# Patient Record
Sex: Female | Born: 1995 | Race: White | Hispanic: No | Marital: Single | Smoking: Never smoker
Health system: Southern US, Community
[De-identification: ages and names within clinical notes are randomized; demographics above are authoritative.]

## PROBLEM LIST (undated history)

## (undated) DIAGNOSIS — F419 Anxiety disorder, unspecified: Secondary | ICD-10-CM

## (undated) DIAGNOSIS — J45909 Unspecified asthma, uncomplicated: Secondary | ICD-10-CM

---

## 2016-08-01 ENCOUNTER — Encounter (HOSPITAL_COMMUNITY): Payer: Self-pay | Admitting: Emergency Medicine

## 2016-08-01 ENCOUNTER — Ambulatory Visit (HOSPITAL_COMMUNITY)
Admission: EM | Admit: 2016-08-01 | Discharge: 2016-08-01 | Disposition: A | Discharge status: Home / Self Care | Payer: BLUE CROSS/BLUE SHIELD | Attending: Emergency Medicine | Admitting: Emergency Medicine

## 2016-08-01 DIAGNOSIS — N730 Acute parametritis and pelvic cellulitis: Secondary | ICD-10-CM | POA: Diagnosis not present

## 2016-08-01 DIAGNOSIS — R109 Unspecified abdominal pain: Secondary | ICD-10-CM | POA: Diagnosis not present

## 2016-08-01 DIAGNOSIS — F419 Anxiety disorder, unspecified: Secondary | ICD-10-CM | POA: Diagnosis not present

## 2016-08-01 DIAGNOSIS — J45909 Unspecified asthma, uncomplicated: Secondary | ICD-10-CM | POA: Insufficient documentation

## 2016-08-01 HISTORY — DX: Unspecified asthma, uncomplicated: J45.909

## 2016-08-01 HISTORY — DX: Anxiety disorder, unspecified: F41.9

## 2016-08-01 MED ORDER — CEFTRIAXONE SODIUM 250 MG IJ SOLR
250.0000 mg | Freq: Once | INTRAMUSCULAR | Status: AC
  Administered 2016-08-01: 250 mg via INTRAMUSCULAR

## 2016-08-01 MED ORDER — DOXYCYCLINE HYCLATE 100 MG PO CAPS: 100.0000 mg | ORAL_CAPSULE | Freq: Two times a day (BID) | ORAL | 0 refills | Status: AC

## 2016-08-01 MED ORDER — AZITHROMYCIN 250 MG PO TABS
1000.0000 mg | ORAL_TABLET | Freq: Once | ORAL | Status: AC
  Administered 2016-08-01: 1000 mg via ORAL

## 2016-08-01 MED ORDER — AZITHROMYCIN 250 MG PO TABS
ORAL_TABLET | ORAL | Status: AC
  Filled 2016-08-01: qty 4

## 2016-08-01 MED ORDER — CEFTRIAXONE SODIUM 250 MG IJ SOLR
INTRAMUSCULAR | Status: AC
  Filled 2016-08-01: qty 250

## 2016-08-01 NOTE — ED Provider Notes (Signed)
[facility]    CSN: [id number] Arrival date & time: 08/01/16  1737     History   Chief Complaint Chief Complaint  Patient presents with  . Abdominal Cramping    HPI Sonya Mcfarland is a 21 y.o. female.   HPI She is a 21 year old woman here for evaluation of lower abdominal pain. She reports severe lower abdominal pain that started this morning. It has been constant. Feels a little better when she lays down. Yesterday she had vomiting and diarrhea. Today she has felt nauseous, but had no vomiting. She did have a small bowel movement, but states it was painful to try to have a bowel movement. She also states she has abdominal discomfort with urination. She denies any dysuria, urgency, or frequency. No fevers. She is sexually active. She has had a new partner recently. They use condoms. She states she has had similar, but less severe episodes of pain previously. She reports for other episodes between Thanksgiving and today.  Past Medical History:  Diagnosis Date  . Anxiety   . Asthma     There are no active problems to display for this patient.   History reviewed. No pertinent surgical history.  OB History    No data available       Home Medications    Prior to Admission medications   Medication Sig Start Date End Date Taking? Authorizing Provider  montelukast (SINGULAIR) 10 MG tablet Take 10 mg by mouth at bedtime.   Yes Historical Provider, MD  Norgestim-Eth Estrad Triphasic (TRINESSA, 28, PO) Take by mouth.   Yes Historical Provider, MD  PARoxetine (PAXIL) 20 MG tablet Take 20 mg by mouth daily.   Yes Historical Provider, MD  doxycycline (VIBRAMYCIN) 100 MG capsule Take 1 capsule (100 mg total) by mouth 2 (two) times daily. 08/01/16   Charm RingsErin J Malessa Zartman, MD    Family History History reviewed. No pertinent family history.  Social History Social History  Substance Use Topics  . Smoking status: Never Smoker  . Smokeless tobacco: Never Used  . Alcohol use Yes     Comment: Occ     Allergies   Patient has no known allergies.   Review of Systems Review of Systems As in history of present illness  Physical Exam Triage Vital Signs ED Triage Vitals  Enc Vitals Group     BP 08/01/16 1916 131/74     Pulse Rate 08/01/16 1916 101     Resp 08/01/16 1916 16     Temp 08/01/16 1916 98.4 F (36.9 C)     Temp Source 08/01/16 1916 Oral     SpO2 08/01/16 1916 99 %     Weight --      Height --      Head Circumference --      Peak Flow --      Pain Score 08/01/16 1921 8     Pain Loc --      Pain Edu? --      Excl. in GC? --    No data found.   Updated Vital Signs BP 131/74 (BP Location: Left Arm)   Pulse 101   Temp 98.4 F (36.9 C) (Oral)   Resp 16   LMP 07/31/2016 (Exact Date)   SpO2 99%   Visual Acuity Right Eye Distance:   Left Eye Distance:   Bilateral Distance:    Right Eye Near:   Left Eye Near:    Bilateral Near:     Physical Exam  Constitutional: She appears well-developed and well-nourished. No distress.  Cardiovascular: Normal rate.   Pulmonary/Chest: Effort normal.  Abdominal: Soft. She exhibits no distension. There is tenderness (all across lower abdomen, slightly worse on the right lower quadrant.). There is no rebound and no guarding.  Positive heel drop.  Genitourinary: There is no rash on the right labia. There is no rash on the left labia. Uterus is tender. Uterus is not enlarged. Cervix exhibits motion tenderness. Cervix exhibits no discharge. Right adnexum displays tenderness. Right adnexum displays no mass. Left adnexum displays no mass and no tenderness. There is bleeding (on period) in the vagina. No foreign body in the vagina. No vaginal discharge found.     UC Treatments / Results  Labs (all labs ordered are listed, but only abnormal results are displayed) Labs Reviewed  CERVICOVAGINAL ANCILLARY ONLY    EKG  EKG Interpretation None       Radiology No results found.  Procedures Procedures  (including critical care time)  Medications Ordered in UC Medications  cefTRIAXone (ROCEPHIN) injection 250 mg (250 mg Intramuscular Given 08/01/16 2029)  azithromycin (ZITHROMAX) tablet 1,000 mg (1,000 mg Oral Given 08/01/16 2031)     Initial Impression / Assessment and Plan / UC Course  I have reviewed the triage vital signs and the nursing notes.  Pertinent labs & imaging results that were available during my care of the patient were reviewed by me and considered in my medical decision making (see chart for details).  Clinical Course     We'll treat for PID with Rocephin and azithromycin here. Prescription for doxycycline sent to pharmacy. Return precautions reviewed. Appendicitis is in the differential, but given cervical motion tenderness, I feel PID is more likely.  Final Clinical Impressions(s) / UC Diagnoses   Final diagnoses:  PID (acute pelvic inflammatory disease)    New Prescriptions New Prescriptions   DOXYCYCLINE (VIBRAMYCIN) 100 MG CAPSULE    Take 1 capsule (100 mg total) by mouth 2 (two) times daily.     Charm Rings, MD 08/01/16 2046

## 2016-08-01 NOTE — ED Triage Notes (Signed)
[facility] a [facility] abdominal cramping with N/V/D that started yesterday.

## 2016-08-01 NOTE — Discharge Instructions (Signed)
I'm concerned you have something called pelvic inflammatory disease. We gave you some medicine here. Take doxycycline twice a day for 10 days. We will call you with the results of your testing in 2-3 days. If you develop fevers, worsening pain, or the pain localizes to the right side, please go to the emergency room.

## 2016-08-02 ENCOUNTER — Emergency Department (HOSPITAL_COMMUNITY)
Admission: EM | Admit: 2016-08-02 | Discharge: 2016-08-02 | Disposition: A | Discharge status: Home / Self Care | Payer: BLUE CROSS/BLUE SHIELD | Attending: Emergency Medicine | Admitting: Emergency Medicine

## 2016-08-02 ENCOUNTER — Emergency Department (HOSPITAL_COMMUNITY): Payer: BLUE CROSS/BLUE SHIELD

## 2016-08-02 ENCOUNTER — Encounter (HOSPITAL_COMMUNITY): Payer: Self-pay | Admitting: Emergency Medicine

## 2016-08-02 DIAGNOSIS — J45909 Unspecified asthma, uncomplicated: Secondary | ICD-10-CM | POA: Diagnosis not present

## 2016-08-02 DIAGNOSIS — B9689 Other specified bacterial agents as the cause of diseases classified elsewhere: Secondary | ICD-10-CM | POA: Diagnosis not present

## 2016-08-02 DIAGNOSIS — R1031 Right lower quadrant pain: Secondary | ICD-10-CM | POA: Diagnosis not present

## 2016-08-02 DIAGNOSIS — N739 Female pelvic inflammatory disease, unspecified: Secondary | ICD-10-CM

## 2016-08-02 LAB — COMPREHENSIVE METABOLIC PANEL
ALT: 13 U/L — ABNORMAL LOW (ref 14–54)
AST: 19 U/L (ref 15–41)
Albumin: 4.1 g/dL (ref 3.5–5.0)
Alkaline Phosphatase: 86 U/L (ref 38–126)
Anion gap: 10 (ref 5–15)
BUN: 14 mg/dL (ref 6–20)
CHLORIDE: 102 mmol/L (ref 101–111)
CO2: 26 mmol/L (ref 22–32)
Calcium: 9.6 mg/dL (ref 8.9–10.3)
Creatinine, Ser: 0.68 mg/dL (ref 0.44–1.00)
Glucose, Bld: 91 mg/dL (ref 65–99)
Potassium: 3.9 mmol/L (ref 3.5–5.1)
Sodium: 138 mmol/L (ref 135–145)
Total Bilirubin: 0.5 mg/dL (ref 0.3–1.2)
Total Protein: 7.7 g/dL (ref 6.5–8.1)

## 2016-08-02 LAB — URINALYSIS, ROUTINE W REFLEX MICROSCOPIC
Bilirubin Urine: NEGATIVE
GLUCOSE, UA: NEGATIVE mg/dL
Ketones, ur: NEGATIVE mg/dL
Leukocytes, UA: NEGATIVE
NITRITE: NEGATIVE
PH: 5 (ref 5.0–8.0)
PROTEIN: NEGATIVE mg/dL
SPECIFIC GRAVITY, URINE: 1.019 (ref 1.005–1.030)

## 2016-08-02 LAB — CBC
HCT: 39.5 % (ref 36.0–46.0)
Hemoglobin: 13 g/dL (ref 12.0–15.0)
MCH: 27.5 pg (ref 26.0–34.0)
MCHC: 32.9 g/dL (ref 30.0–36.0)
MCV: 83.5 fL (ref 78.0–100.0)
Platelets: 337 10*3/uL (ref 150–400)
RBC: 4.73 MIL/uL (ref 3.87–5.11)
RDW: 14.2 % (ref 11.5–15.5)
WBC: 7.6 10*3/uL (ref 4.0–10.5)

## 2016-08-02 LAB — I-STAT BETA HCG BLOOD, ED (MC, WL, AP ONLY): I-stat hCG, quantitative: <5 m[IU]/mL (ref <5)

## 2016-08-02 LAB — LIPASE, BLOOD: LIPASE: 17 U/L (ref 11–51)

## 2016-08-02 MED ORDER — ONDANSETRON 4 MG PO TBDP
4.0000 mg | ORAL_TABLET | Freq: Once | ORAL | Status: AC | PRN
  Administered 2016-08-02: 4 mg via ORAL

## 2016-08-02 MED ORDER — ONDANSETRON 4 MG PO TBDP
ORAL_TABLET | ORAL | Status: AC
  Filled 2016-08-02: qty 1

## 2016-08-02 MED ORDER — SODIUM CHLORIDE 0.9 % IV BOLUS (SEPSIS)
1000.0000 mL | Freq: Once | INTRAVENOUS | Status: AC
Start: 2016-08-02 — End: 2016-08-02
  Administered 2016-08-02: 1000 mL via INTRAVENOUS

## 2016-08-02 MED ORDER — TAPENTADOL HCL 50 MG PO TABS: 25.0000 mg | ORAL_TABLET | Freq: Three times a day (TID) | ORAL | 0 refills | Status: AC | PRN

## 2016-08-02 MED ORDER — SODIUM CHLORIDE 0.9 % IV BOLUS (SEPSIS)
1000.0000 mL | Freq: Once | INTRAVENOUS | Status: AC
  Administered 2016-08-02: 1000 mL via INTRAVENOUS

## 2016-08-02 MED ORDER — MORPHINE SULFATE (PF) 4 MG/ML IV SOLN
4.0000 mg | Freq: Once | INTRAVENOUS | Status: AC
  Administered 2016-08-02: 4 mg via INTRAVENOUS
  Filled 2016-08-02: qty 1

## 2016-08-02 MED ORDER — ONDANSETRON HCL 4 MG/2ML IJ SOLN
4.0000 mg | Freq: Once | INTRAMUSCULAR | Status: AC
  Administered 2016-08-02: 4 mg via INTRAVENOUS
  Filled 2016-08-02: qty 2

## 2016-08-02 MED ORDER — ONDANSETRON HCL 4 MG PO TABS: 4.0000 mg | ORAL_TABLET | Freq: Three times a day (TID) | ORAL | 0 refills | Status: AC | PRN

## 2016-08-02 MED ORDER — IOPAMIDOL (ISOVUE-300) INJECTION 61%
INTRAVENOUS | Status: AC
  Administered 2016-08-02: 100 mL
  Filled 2016-08-02: qty 100

## 2016-08-02 NOTE — ED Notes (Signed)
Pt transported to CT ?

## 2016-08-02 NOTE — ED Notes (Signed)
ED Provider at bedside. 

## 2016-08-02 NOTE — ED Triage Notes (Signed)
Pt sts RLQ pain with N/V/D starting 2 days ago; [facility]; pt sts pain worse and more on right sided this am

## 2016-08-02 NOTE — ED Notes (Addendum)
Pt mother expressing concern over daughter's d/c. Dr. Ladona Ridgelaylor aware, will have Dr. Jacqulyn BathLong assess pt further before she leaves.

## 2016-08-02 NOTE — ED Notes (Signed)
This RN called again to CT was told they will be coming shortly to take patient to CT.

## 2016-08-02 NOTE — ED Notes (Signed)
This RN called CT to inform them she is ready for transport.

## 2016-08-02 NOTE — ED Provider Notes (Signed)
[facility] Note   CSN: [id number] Arrival date & time: 08/02/16  1157     History   Chief Complaint Chief Complaint  Patient presents with  . Abdominal Pain    HPI Sonya Mcfarland is a 21 y.o. female. Sonya Mcfarland is a 21 year old female with no significant past medical history who presents with 36 hour history of abdominal pain, nausea and vomiting. The patient first developed abdominal pain yesterday which woke her up from sleep. This pain was located bilaterally in the lower quadrants and was not well localized. It was a crampy, intense, contuous pain. At this time the patient also had several episodes of diarrhea. She went to an urgent care center and was evaluated. At that time she was afebrile and hemodynamically stable. Pregnancy test was negative. Physical examination at her prior visit demonstrated cervical motion tenderness and a diagnosis of pelvic inflammatory disease was made. She was given a single dose of ceftriaxone and azithromycin and a prescription for doxycycline to complete in the outpatient setting to cover the most common pathogens associated with pelvic inflammatory disease. At time of discharge her return instructions indicated that if she developed nausea, vomiting or if the pain became localized to return to the emergency department. This morning the patient says that her pain became more localized to the right side of her umbilicus. She also developed several episodes of nonbloody nonbilious biliary emesis. She continued to have diarrhea. At presentation to the emergency department today the patient has been afebrile. She is tachycardic but is otherwise hemodynamically stable. Initial laboratory evaluation was unremarkable. She denied chest pain or shortness of breath. She denied constipation. She denied joint aches. She denied fevers. She denied new foods, diet or travel. She denies any sick contacts. She has no additional acute complaints or concerns  today. HPI  Past Medical History:  Diagnosis Date  . Anxiety   . Asthma     There are no active problems to display for this patient.   History reviewed. No pertinent surgical history.  OB History    No data available       Home Medications    Prior to Admission medications   Medication Sig Start Date End Date Taking? Authorizing Provider  doxycycline (VIBRAMYCIN) 100 MG capsule Take 1 capsule (100 mg total) by mouth 2 (two) times daily. 08/01/16   Charm RingsErin J Honig, MD  montelukast (SINGULAIR) 10 MG tablet Take 10 mg by mouth at bedtime.    Historical Provider, MD  Norgestim-Eth Estrad Triphasic (TRINESSA, 28, PO) Take by mouth.    Historical Provider, MD  ondansetron (ZOFRAN) 4 MG tablet Take 1 tablet (4 mg total) by mouth every 8 (eight) hours as needed for nausea or vomiting. 08/02/16   Thomasene LotJames Willard Madrigal, MD  PARoxetine (PAXIL) 20 MG tablet Take 20 mg by mouth daily.    Historical Provider, MD  tapentadol (NUCYNTA) 50 MG tablet Take 0.5 tablets (25 mg total) by mouth every 8 (eight) hours as needed. 08/02/16   Thomasene LotJames Adedamola Seto, MD    Family History History reviewed. No pertinent family history.  Social History Social History  Substance Use Topics  . Smoking status: Never Smoker  . Smokeless tobacco: Never Used  . Alcohol use Yes     Comment: Occ     Allergies   Patient has no known allergies.   Review of Systems Review of Systems  All other systems reviewed and are negative.    Physical Exam Updated Vital Signs BP 112/79 (BP  Location: Right Arm)   Pulse 112   Temp 98 F (36.7 C) (Oral)   Resp 18   LMP 07/31/2016 (Exact Date)   SpO2 99%   Physical Exam  Constitutional: She is oriented to person, place, and time. She appears well-developed and well-nourished.  HENT:  Head: Normocephalic.  Cardiovascular: Exam reveals no friction rub.   No murmur heard. Tachycardic on auscultation.  Pulmonary/Chest: Effort normal and breath sounds normal. No respiratory distress.  She has no wheezes.  Abdominal: Bowel sounds are normal. There is tenderness.  Tenderness to palpation periumbilically and in the suprapubic area. No tenderness to palpation in the left lower left upper quadrant. No rebound or guarding. Not a surgical abdomen.  Musculoskeletal: She exhibits no edema.  Neurological: She is alert and oriented to person, place, and time.     ED Treatments / Results  Labs (all labs ordered are listed, but only abnormal results are displayed) Labs Reviewed  COMPREHENSIVE METABOLIC PANEL - Abnormal; Notable for the following:       Result Value   ALT 13 (*)    All other components within normal limits  URINALYSIS, ROUTINE W REFLEX MICROSCOPIC - Abnormal; Notable for the following:    APPearance HAZY (*)    Hgb urine dipstick MODERATE (*)    Bacteria, UA MANY (*)    Squamous Epithelial / LPF 0-5 (*)    All other components within normal limits  LIPASE, BLOOD  CBC  I-STAT BETA HCG BLOOD, ED ([facility], [facility], [facility] ONLY)    EKG  EKG Interpretation None       Radiology Ct Abdomen Pelvis W Contrast  Result Date: 08/02/2016 CLINICAL DATA:  Right lower quadrant abdominal pain, nausea, vomiting and diarrhea for 2 days. EXAM: CT ABDOMEN AND PELVIS WITH CONTRAST TECHNIQUE: Multidetector CT imaging of the abdomen and pelvis was performed using the standard protocol following bolus administration of intravenous contrast. CONTRAST:  ISOVUE-300 IOPAMIDOL (ISOVUE-300) INJECTION 61% COMPARISON:  None FINDINGS: Lower chest: The lung bases are clear of acute process. No pleural effusion or pulmonary lesions. The heart is normal in size. No pericardial effusion. The distal esophagus and aorta are unremarkable. Hepatobiliary: No focal hepatic lesions or intrahepatic biliary dilatation. The gallbladder is normal. No common bile duct dilatation. A Pancreas: No mass, inflammation or ductal dilatation. Spleen: Normal size.  No focal lesions. Adrenals/Urinary Tract: The adrenal  glands and kidneys are unremarkable. Stomach/Bowel: The stomach, duodenum, small bowel and colon are grossly normal without oral contrast. No obvious inflammatory changes, mass lesions or obstructive findings. The terminal ileum appears normal. The appendix is not identified for certain but I do not see any definite findings for acute appendicitis. Oral contrast would have been helpful as there are fluid-filled loops of small bowel in the right lower quadrant. Vascular/Lymphatic: The aorta and branch vessels are normal. The major venous structures are patent. Moderate sized scattered mesenteric lymph nodes suggesting mesenteric adenitis. Reproductive: Normal uterus and ovaries. Other: No pelvic mass or adenopathy. No free pelvic fluid collections. No inguinal mass or adenopathy. No abdominal wall hernia or subcutaneous lesions. Musculoskeletal: No significant bony findings. IMPRESSION: 1. Numerous borderline mesenteric lymph nodes suggesting mesenteric adenitis. 2. No definite CT findings for appendicitis but the appendix is not identified for certain. Exam limited by un opacified small bowel loops in the right lower quadrant. Recommend close clinical observation and repeat study with oral contrast if the patient does not improve. 3. Normal CT appearance of the uterus and ovaries. 4.  No acute abdominal findings. Electronically Signed   By: Rudie Meyer M.D.   On: 08/02/2016 19:31    Procedures Procedures (including critical care time)  Medications Ordered in ED Medications  ondansetron (ZOFRAN-ODT) disintegrating tablet 4 mg (4 mg Oral Given 08/02/16 1207)  sodium chloride 0.9 % bolus 1,000 mL (0 mLs Intravenous Stopped 08/02/16 1946)  sodium chloride 0.9 % bolus 1,000 mL (0 mLs Intravenous Stopped 08/02/16 1840)  morphine 4 MG/ML injection 4 mg (4 mg Intravenous Given 08/02/16 1729)  ondansetron (ZOFRAN) injection 4 mg (4 mg Intravenous Given 08/02/16 1728)  iopamidol (ISOVUE-300) 61 % injection (100 mLs   Contrast Given 08/02/16 1904)     Initial Impression / Assessment and Plan / ED Course  I have reviewed the triage vital signs and the nursing notes.  Pertinent labs & imaging results that were available during my care of the patient were reviewed by me and considered in my medical decision making (see chart for details).  Clinical Course     Patient presents with ongoing periumbilical abdominal pain associated with nausea, vomiting and diarrhea. Recently, a diagnosis of pelvic inflammatory disease was made yesterday at urgent care clinic and she was treated with antimicrobial therapy. Her pain has become more localized since and she states that her  nausea and vomiting are new. Currently the differential diagnosis includes pelvic inflammatory disease, appendicitis, tubo-ovarian cyst or dysmenorrhea. Initial laboratory evaluation is unremarkable. The patient was tachycardic on examination and I think this is most likely secondary to dehydration in the setting of emesis. At this time we'll proceed with CT abdomen and pelvis with oral and IV contrast to evaluate the above pathology. We will also give the patient a 1 L normal saline bolus. CT abdomen and pelvis with contrast showed mesenteric adenitis. Appendix was not appreciated on this. However, given the patient's laboratory evaluation is normal, she is afebrile and her examination is not consistent with acute appendicitis I think the most likely etiology of the patient's abdominal pain is either secondary to pelvic inflammatory disease with reactive mesenteric lymph nodes or acute viral gastroenteritis. For now, I will provide the patient with prescriptions for supportive medications including Zofran and tapentadol and discussed with her signs and symptoms that would be concerning and would require her to return to the hospital. Additionally, I'll recommend that she follow-up with a primary care physician in 24-48 hours for reexamination and evaluation.  At the time of discharge she was afebrile. Again, she will need follow-up with primary care physician in 24-48 hours for reexamination. I will also recommend that she finish her course of doxycycline prescribed at her previous visit.  Final Clinical Impressions(s) / ED Diagnoses   Final diagnoses:  Pelvic inflammatory disease (PID)    New Prescriptions New Prescriptions   ONDANSETRON (ZOFRAN) 4 MG TABLET    Take 1 tablet (4 mg total) by mouth every 8 (eight) hours as needed for nausea or vomiting.   TAPENTADOL (NUCYNTA) 50 MG TABLET    Take 0.5 tablets (25 mg total) by mouth every 8 (eight) hours as needed.     Thomasene Lot, MD 08/02/16 1951    Thomasene Lot, MD 08/02/16 2009    Maia Plan, MD 08/03/16 [phone]

## 2016-08-02 NOTE — Discharge Instructions (Signed)
I think the most likely cause of your abdominal pain is either due to pelvic inflammatory disease or viral gastroenteritis. Please finish the doxycycline you were prescribed yesterday. I will give you a prescription for Zofran which you can take as needed for nausea as well as a prescription for tramadol which she may take for pain. These medications may make you sleepy so please avoid driving after taking them. My suspicion for appendicitis is extremely low given that your lab work is normal, you did not have a fever and your physical exam is not classic for appendicitis. As we discussed, your CT scan showed mesenteric adenitis but the appendix was unable to be visualized. I would like for you to be seen by a physician or physician extender in the next 24-48 hours for reevaluation.  To the evaluating physician: The patient was diagnosed with pelvic inflammatory disease based on cervical motion tenderness and history by a urgent care center on 08/01/2016. At this visit she was given a single dose of ceftriaxone and azithromycin and prescribed an oral course of doxycycline. She returned to the emergency department on 08/02/2016 with ongoing abdominal pain with nausea and vomiting. Her laboratory evaluation was unremarkable. Her CT scan showed mesenteric adenitis but was unable to visualize the appendix. She was afebrile and hemodynamically stable and her physical examination was not consistent with acute appendicitis. However, I would like for her to be evaluated to ensure her abdominal pain is improving and that she does not have increased tenderness in the right lower quadrant. Please evaluate the patient to ensure that she is improving and has not progressed.

## 2016-08-02 NOTE — ED Notes (Signed)
Pt ambulatory to room with independent steady gait, NAD 

## 2016-08-02 NOTE — ED Notes (Signed)
Pt returned from CT °

## 2016-08-03 LAB — CERVICOVAGINAL ANCILLARY ONLY
CHLAMYDIA, DNA PROBE: NEGATIVE
Neisseria Gonorrhea: NEGATIVE
Wet Prep (BD Affirm): NEGATIVE

## 2016-09-12 DIAGNOSIS — F411 Generalized anxiety disorder: Secondary | ICD-10-CM | POA: Diagnosis not present

## 2016-09-19 DIAGNOSIS — F411 Generalized anxiety disorder: Secondary | ICD-10-CM | POA: Diagnosis not present

## 2016-09-23 DIAGNOSIS — F418 Other specified anxiety disorders: Secondary | ICD-10-CM | POA: Diagnosis not present

## 2016-09-23 DIAGNOSIS — J4520 Mild intermittent asthma, uncomplicated: Secondary | ICD-10-CM | POA: Diagnosis not present

## 2016-09-23 DIAGNOSIS — Z832 Family history of diseases of the blood and blood-forming organs and certain disorders involving the immune mechanism: Secondary | ICD-10-CM | POA: Diagnosis not present

## 2016-09-23 DIAGNOSIS — J301 Allergic rhinitis due to pollen: Secondary | ICD-10-CM | POA: Diagnosis not present

## 2016-09-28 DIAGNOSIS — D500 Iron deficiency anemia secondary to blood loss (chronic): Secondary | ICD-10-CM | POA: Diagnosis not present

## 2016-10-05 DIAGNOSIS — F411 Generalized anxiety disorder: Secondary | ICD-10-CM | POA: Diagnosis not present

## 2016-10-10 DIAGNOSIS — F411 Generalized anxiety disorder: Secondary | ICD-10-CM | POA: Diagnosis not present

## 2016-10-17 DIAGNOSIS — F411 Generalized anxiety disorder: Secondary | ICD-10-CM | POA: Diagnosis not present

## 2016-10-24 DIAGNOSIS — F411 Generalized anxiety disorder: Secondary | ICD-10-CM | POA: Diagnosis not present

## 2016-10-31 DIAGNOSIS — F411 Generalized anxiety disorder: Secondary | ICD-10-CM | POA: Diagnosis not present

## 2016-11-07 DIAGNOSIS — F411 Generalized anxiety disorder: Secondary | ICD-10-CM | POA: Diagnosis not present

## 2016-11-21 DIAGNOSIS — F411 Generalized anxiety disorder: Secondary | ICD-10-CM | POA: Diagnosis not present

## 2016-12-06 DIAGNOSIS — D500 Iron deficiency anemia secondary to blood loss (chronic): Secondary | ICD-10-CM | POA: Diagnosis not present

## 2016-12-13 DIAGNOSIS — N761 Subacute and chronic vaginitis: Secondary | ICD-10-CM | POA: Diagnosis not present

## 2016-12-13 DIAGNOSIS — R8299 Other abnormal findings in urine: Secondary | ICD-10-CM | POA: Diagnosis not present

## 2016-12-13 DIAGNOSIS — Z113 Encounter for screening for infections with a predominantly sexual mode of transmission: Secondary | ICD-10-CM | POA: Diagnosis not present

## 2016-12-13 DIAGNOSIS — R3129 Other microscopic hematuria: Secondary | ICD-10-CM | POA: Diagnosis not present

## 2016-12-13 DIAGNOSIS — R102 Pelvic and perineal pain: Secondary | ICD-10-CM | POA: Diagnosis not present

## 2016-12-23 DIAGNOSIS — D500 Iron deficiency anemia secondary to blood loss (chronic): Secondary | ICD-10-CM | POA: Diagnosis not present

## 2016-12-23 DIAGNOSIS — Z68.41 Body mass index (BMI) pediatric, 5th percentile to less than 85th percentile for age: Secondary | ICD-10-CM | POA: Diagnosis not present

## 2016-12-23 DIAGNOSIS — R5383 Other fatigue: Secondary | ICD-10-CM | POA: Diagnosis not present

## 2016-12-23 DIAGNOSIS — D680 Von Willebrand's disease: Secondary | ICD-10-CM | POA: Diagnosis not present

## 2016-12-28 DIAGNOSIS — R5383 Other fatigue: Secondary | ICD-10-CM | POA: Diagnosis not present

## 2017-01-10 DIAGNOSIS — Z124 Encounter for screening for malignant neoplasm of cervix: Secondary | ICD-10-CM | POA: Diagnosis not present

## 2017-01-10 DIAGNOSIS — D680 Von Willebrand's disease: Secondary | ICD-10-CM | POA: Diagnosis not present

## 2017-01-10 DIAGNOSIS — N94810 Vulvar vestibulitis: Secondary | ICD-10-CM | POA: Diagnosis not present

## 2017-01-10 DIAGNOSIS — Z30011 Encounter for initial prescription of contraceptive pills: Secondary | ICD-10-CM | POA: Diagnosis not present

## 2017-01-10 DIAGNOSIS — Z3009 Encounter for other general counseling and advice on contraception: Secondary | ICD-10-CM | POA: Diagnosis not present

## 2017-01-10 DIAGNOSIS — Z01419 Encounter for gynecological examination (general) (routine) without abnormal findings: Secondary | ICD-10-CM | POA: Diagnosis not present

## 2017-01-10 DIAGNOSIS — Z6826 Body mass index (BMI) 26.0-26.9, adult: Secondary | ICD-10-CM | POA: Diagnosis not present

## 2017-01-29 DIAGNOSIS — N390 Urinary tract infection, site not specified: Secondary | ICD-10-CM | POA: Diagnosis not present

## 2017-02-01 DIAGNOSIS — E611 Iron deficiency: Secondary | ICD-10-CM | POA: Diagnosis not present

## 2017-02-01 DIAGNOSIS — D680 Von Willebrand's disease: Secondary | ICD-10-CM | POA: Diagnosis not present

## 2017-02-02 DIAGNOSIS — R5382 Chronic fatigue, unspecified: Secondary | ICD-10-CM | POA: Diagnosis not present

## 2017-02-02 DIAGNOSIS — M25561 Pain in right knee: Secondary | ICD-10-CM | POA: Diagnosis not present

## 2017-02-02 DIAGNOSIS — M25562 Pain in left knee: Secondary | ICD-10-CM | POA: Diagnosis not present

## 2017-02-02 DIAGNOSIS — N390 Urinary tract infection, site not specified: Secondary | ICD-10-CM | POA: Diagnosis not present

## 2017-02-10 DIAGNOSIS — M25562 Pain in left knee: Secondary | ICD-10-CM | POA: Diagnosis not present

## 2017-02-10 DIAGNOSIS — Z68.41 Body mass index (BMI) pediatric, 5th percentile to less than 85th percentile for age: Secondary | ICD-10-CM | POA: Diagnosis not present

## 2017-02-10 DIAGNOSIS — M25561 Pain in right knee: Secondary | ICD-10-CM | POA: Diagnosis not present

## 2017-02-10 DIAGNOSIS — M79642 Pain in left hand: Secondary | ICD-10-CM | POA: Diagnosis not present

## 2017-02-10 DIAGNOSIS — M2550 Pain in unspecified joint: Secondary | ICD-10-CM | POA: Diagnosis not present

## 2017-02-10 DIAGNOSIS — M79641 Pain in right hand: Secondary | ICD-10-CM | POA: Diagnosis not present

## 2017-02-10 DIAGNOSIS — R5382 Chronic fatigue, unspecified: Secondary | ICD-10-CM | POA: Diagnosis not present

## 2017-03-06 DIAGNOSIS — N938 Other specified abnormal uterine and vaginal bleeding: Secondary | ICD-10-CM | POA: Diagnosis not present

## 2017-03-06 DIAGNOSIS — R102 Pelvic and perineal pain: Secondary | ICD-10-CM | POA: Diagnosis not present

## 2017-03-06 DIAGNOSIS — R87612 Low grade squamous intraepithelial lesion on cytologic smear of cervix (LGSIL): Secondary | ICD-10-CM | POA: Diagnosis not present

## 2017-03-06 DIAGNOSIS — D680 Von Willebrand's disease: Secondary | ICD-10-CM | POA: Diagnosis not present

## 2017-03-24 DIAGNOSIS — H6982 Other specified disorders of Eustachian tube, left ear: Secondary | ICD-10-CM | POA: Diagnosis not present

## 2017-03-24 DIAGNOSIS — Z68.41 Body mass index (BMI) pediatric, 5th percentile to less than 85th percentile for age: Secondary | ICD-10-CM | POA: Diagnosis not present

## 2017-03-24 DIAGNOSIS — H9202 Otalgia, left ear: Secondary | ICD-10-CM | POA: Diagnosis not present

## 2017-05-01 DIAGNOSIS — M2550 Pain in unspecified joint: Secondary | ICD-10-CM | POA: Diagnosis not present

## 2017-05-01 DIAGNOSIS — R87612 Low grade squamous intraepithelial lesion on cytologic smear of cervix (LGSIL): Secondary | ICD-10-CM | POA: Diagnosis not present

## 2017-05-01 DIAGNOSIS — N72 Inflammatory disease of cervix uteri: Secondary | ICD-10-CM | POA: Diagnosis not present

## 2017-06-23 DIAGNOSIS — B373 Candidiasis of vulva and vagina: Secondary | ICD-10-CM | POA: Diagnosis not present

## 2017-07-26 DIAGNOSIS — Z68.41 Body mass index (BMI) pediatric, 5th percentile to less than 85th percentile for age: Secondary | ICD-10-CM | POA: Diagnosis not present

## 2017-07-26 DIAGNOSIS — M2550 Pain in unspecified joint: Secondary | ICD-10-CM | POA: Diagnosis not present

## 2017-09-20 ENCOUNTER — Other Ambulatory Visit: Payer: Self-pay | Admitting: Internal Medicine

## 2017-09-20 DIAGNOSIS — G4489 Other headache syndrome: Secondary | ICD-10-CM

## 2017-09-20 DIAGNOSIS — F418 Other specified anxiety disorders: Secondary | ICD-10-CM | POA: Diagnosis not present

## 2017-09-20 DIAGNOSIS — R1030 Lower abdominal pain, unspecified: Secondary | ICD-10-CM | POA: Diagnosis not present

## 2017-09-20 DIAGNOSIS — D680 Von Willebrand's disease: Secondary | ICD-10-CM | POA: Diagnosis not present

## 2017-10-05 ENCOUNTER — Ambulatory Visit
Admission: RE | Admit: 2017-10-05 | Discharge: 2017-10-05 | Disposition: A | Discharge status: Home / Self Care | Payer: BLUE CROSS/BLUE SHIELD | Source: Ambulatory Visit | Attending: Internal Medicine | Admitting: Internal Medicine

## 2017-10-05 DIAGNOSIS — G4489 Other headache syndrome: Secondary | ICD-10-CM

## 2017-10-05 DIAGNOSIS — R51 Headache: Secondary | ICD-10-CM | POA: Diagnosis not present

## 2017-10-05 MED ORDER — IOPAMIDOL (ISOVUE-300) INJECTION 61%
75.0000 mL | Freq: Once | INTRAVENOUS | Status: AC | PRN
  Administered 2017-10-05: 75 mL via INTRAVENOUS

## 2017-11-09 DIAGNOSIS — N390 Urinary tract infection, site not specified: Secondary | ICD-10-CM | POA: Diagnosis not present

## 2017-11-24 DIAGNOSIS — B373 Candidiasis of vulva and vagina: Secondary | ICD-10-CM | POA: Diagnosis not present

## 2017-11-26 IMAGING — CT CT ABD-PELV W/ CM
2 of 4 series · 11 of 46 positions shown, 12 images · IV contrast (Iodine)
Comparison: None

CLINICAL DATA: Right lower quadrant abdominal pain, nausea,
vomiting and diarrhea for 2 days.

EXAM:
CT ABDOMEN AND PELVIS WITH CONTRAST
TECHNIQUE: Multidetector CT imaging of the abdomen and pelvis was performed
using the standard protocol following bolus administration of
intravenous contrast.
CONTRAST:  100mL [id number] IOPAMIDOL ([id number]) INJECTION 61%

[Series 201: routine, idose (2) · axial · 0.68mm/px · z∈[+692,+1062]mm · 8 of 90 slices shown, 9 images]
[im 8/90  soft-tissue]
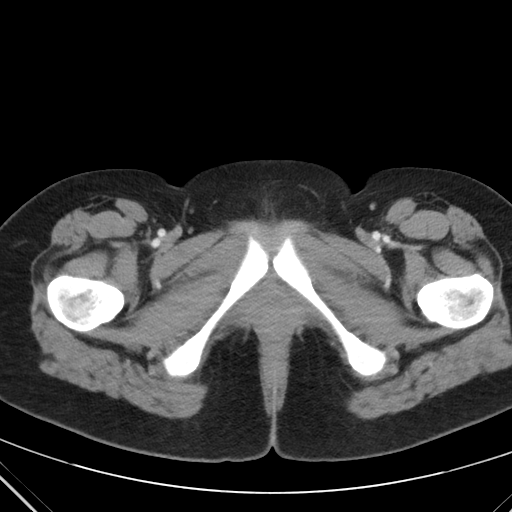
[im 8/90  bone]
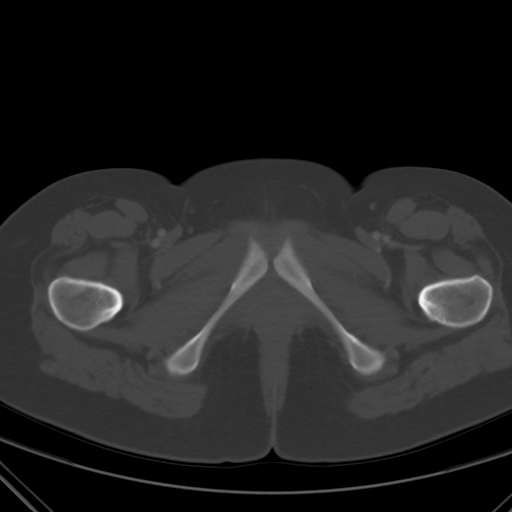
[im 18/90  soft-tissue]
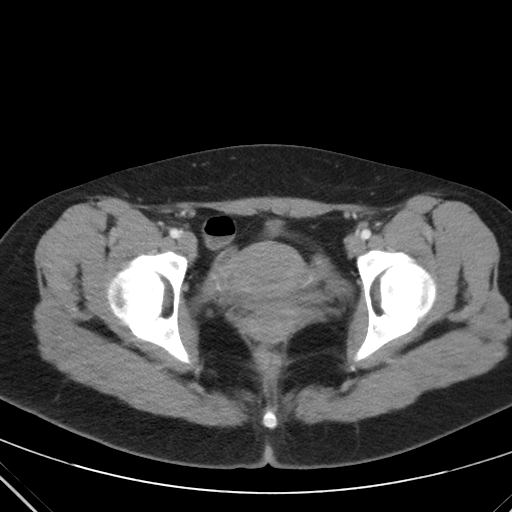
[im 29/90  soft-tissue]
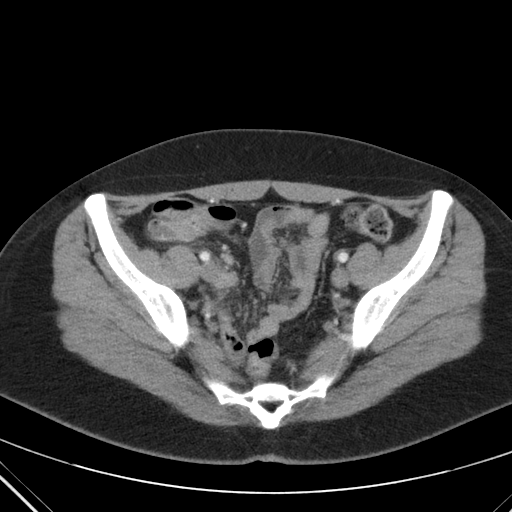
[im 40/90  soft-tissue]
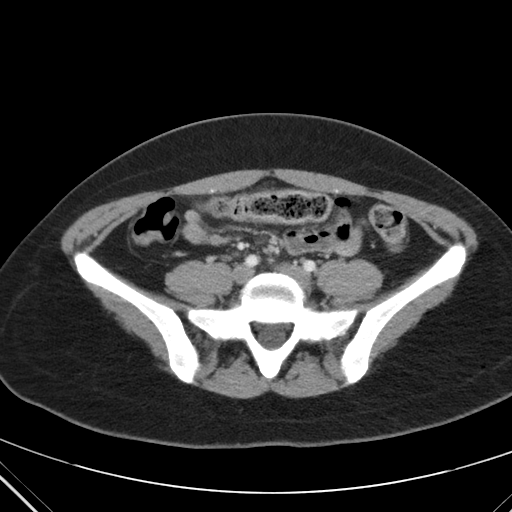
[im 50/90  soft-tissue]
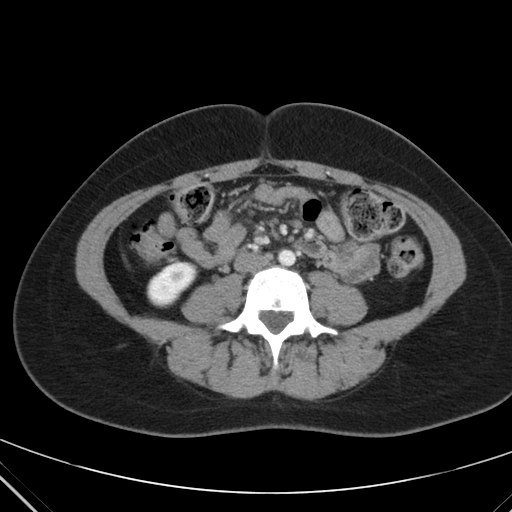
[im 61/90  soft-tissue]
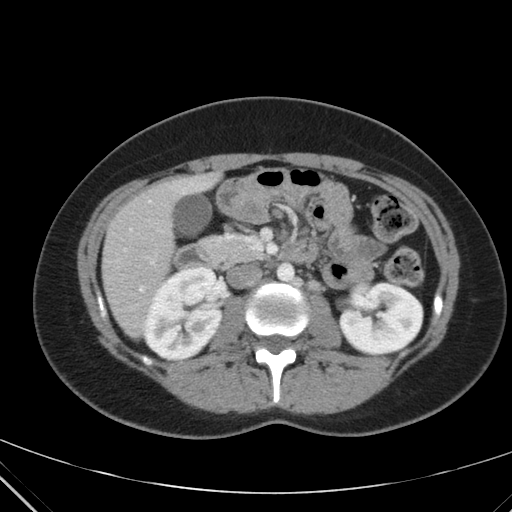
[im 72/90  soft-tissue]
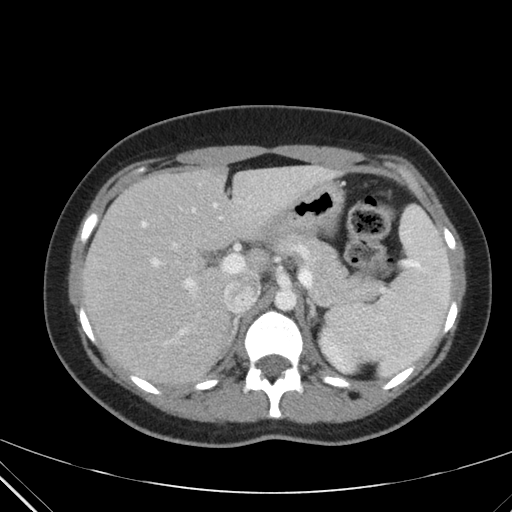
[im 82/90  soft-tissue]
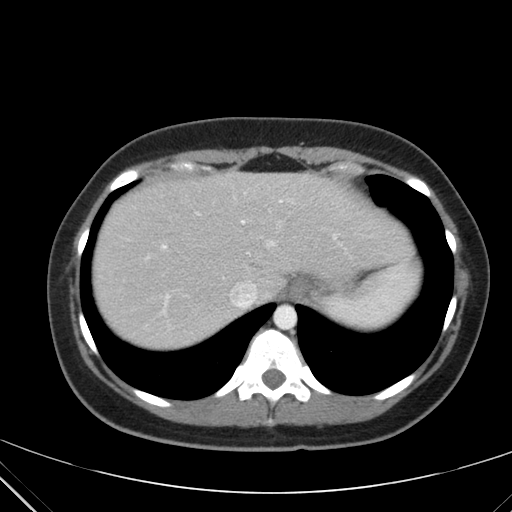

[Series 203: coronals, idose (2) · coronal · 0.45mm/px · 3 of 117 slices shown]
[im 39/117  soft-tissue]
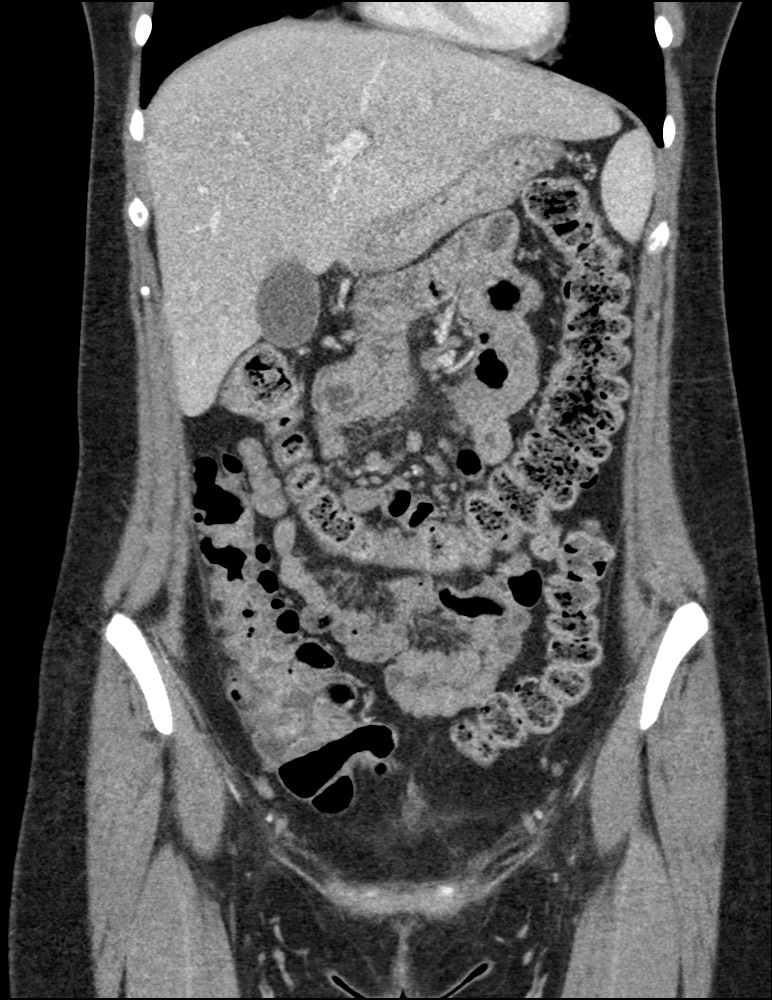
[im 52/117  soft-tissue]
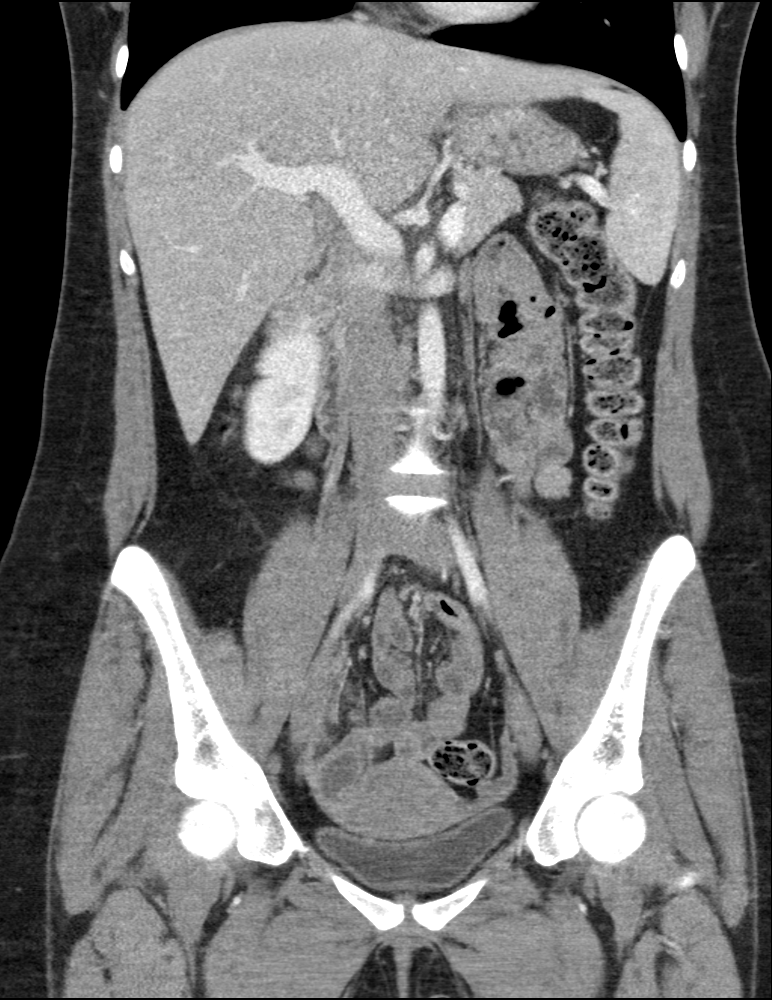
[im 65/117  soft-tissue]
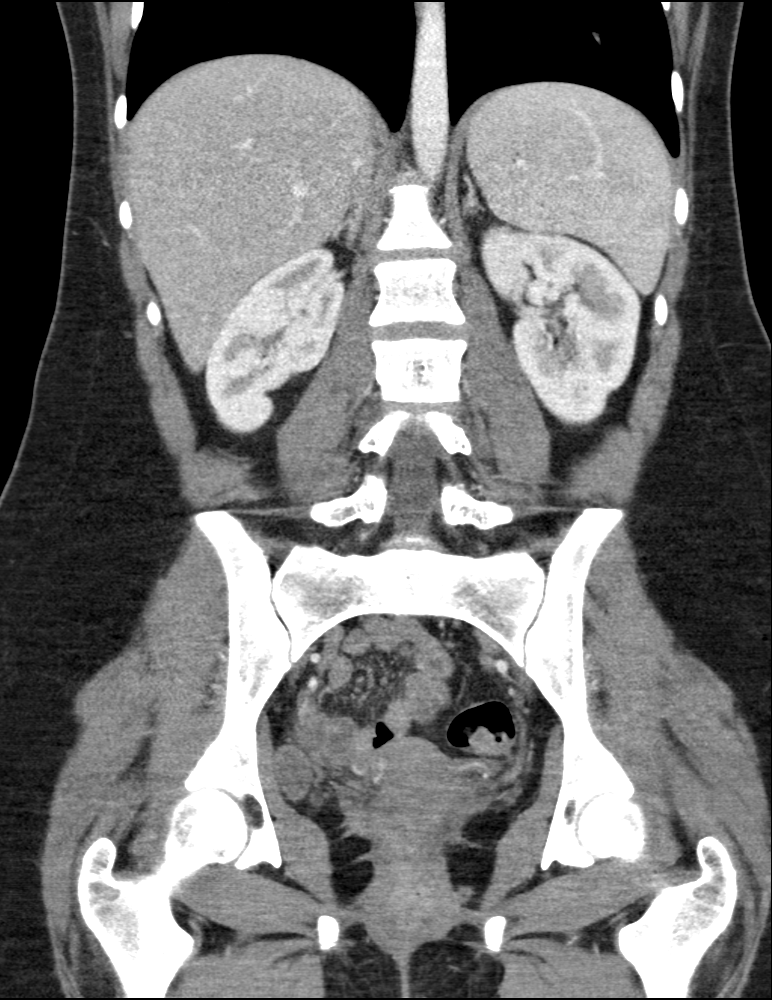

[11 of 46 positions shown; findings below may reference images not displayed]

FINDINGS: Lower chest: The lung bases are clear of acute process. No pleural
effusion or pulmonary lesions. The heart is normal in size. No
pericardial effusion. The distal esophagus and aorta are
unremarkable.

Hepatobiliary: No focal hepatic lesions or intrahepatic biliary
dilatation. The gallbladder is normal. No common bile duct
dilatation. A

Pancreas: No mass, inflammation or ductal dilatation.

Spleen: Normal size.  No focal lesions.

Adrenals/Urinary Tract: The adrenal glands and kidneys are
unremarkable.

Stomach/Bowel: The stomach, duodenum, small bowel and colon are
grossly normal without oral contrast. No obvious inflammatory
changes, mass lesions or obstructive findings. The terminal ileum
appears normal. The appendix is not identified for certain but I do
not see any definite findings for acute appendicitis. Oral contrast
would have been helpful as there are fluid-filled loops of small
bowel in the right lower quadrant.

Vascular/Lymphatic: The aorta and branch vessels are normal. The
major venous structures are patent.

Moderate sized scattered mesenteric lymph nodes suggesting
mesenteric adenitis.

Reproductive: Normal uterus and ovaries.

Other: No pelvic mass or adenopathy. No free pelvic fluid
collections. No inguinal mass or adenopathy. No abdominal wall
hernia or subcutaneous lesions.

Musculoskeletal: No significant bony findings.
IMPRESSION: 1. Numerous borderline mesenteric lymph nodes suggesting mesenteric
adenitis.
2. No definite CT findings for appendicitis but the appendix is not
identified for certain. Exam limited by un opacified small bowel
loops in the right lower quadrant. Recommend close clinical
observation and repeat study with oral contrast if the patient does
not improve.
3. Normal CT appearance of the uterus and ovaries.
4. No acute abdominal findings.

## 2017-12-05 DIAGNOSIS — J301 Allergic rhinitis due to pollen: Secondary | ICD-10-CM | POA: Diagnosis not present

## 2017-12-05 DIAGNOSIS — R05 Cough: Secondary | ICD-10-CM | POA: Diagnosis not present

## 2017-12-05 DIAGNOSIS — J3081 Allergic rhinitis due to animal (cat) (dog) hair and dander: Secondary | ICD-10-CM | POA: Diagnosis not present

## 2017-12-05 DIAGNOSIS — J309 Allergic rhinitis, unspecified: Secondary | ICD-10-CM | POA: Diagnosis not present

## 2017-12-12 DIAGNOSIS — Z68.41 Body mass index (BMI) pediatric, 5th percentile to less than 85th percentile for age: Secondary | ICD-10-CM | POA: Diagnosis not present

## 2017-12-12 DIAGNOSIS — M2550 Pain in unspecified joint: Secondary | ICD-10-CM | POA: Diagnosis not present

## 2017-12-12 DIAGNOSIS — Z79899 Other long term (current) drug therapy: Secondary | ICD-10-CM | POA: Diagnosis not present

## 2017-12-14 DIAGNOSIS — H538 Other visual disturbances: Secondary | ICD-10-CM | POA: Diagnosis not present

## 2017-12-14 DIAGNOSIS — H5213 Myopia, bilateral: Secondary | ICD-10-CM | POA: Diagnosis not present

## 2017-12-20 DIAGNOSIS — R350 Frequency of micturition: Secondary | ICD-10-CM | POA: Diagnosis not present

## 2017-12-20 DIAGNOSIS — R3911 Hesitancy of micturition: Secondary | ICD-10-CM | POA: Diagnosis not present

## 2017-12-20 DIAGNOSIS — R3915 Urgency of urination: Secondary | ICD-10-CM | POA: Diagnosis not present

## 2017-12-20 DIAGNOSIS — R300 Dysuria: Secondary | ICD-10-CM | POA: Diagnosis not present

## 2017-12-20 DIAGNOSIS — R102 Pelvic and perineal pain: Secondary | ICD-10-CM | POA: Diagnosis not present

## 2018-02-05 DIAGNOSIS — Z3041 Encounter for surveillance of contraceptive pills: Secondary | ICD-10-CM | POA: Diagnosis not present

## 2018-02-05 DIAGNOSIS — Z3009 Encounter for other general counseling and advice on contraception: Secondary | ICD-10-CM | POA: Diagnosis not present

## 2018-02-05 DIAGNOSIS — Z124 Encounter for screening for malignant neoplasm of cervix: Secondary | ICD-10-CM | POA: Diagnosis not present

## 2018-02-05 DIAGNOSIS — Z6825 Body mass index (BMI) 25.0-25.9, adult: Secondary | ICD-10-CM | POA: Diagnosis not present

## 2018-02-05 DIAGNOSIS — R87610 Atypical squamous cells of undetermined significance on cytologic smear of cervix (ASC-US): Secondary | ICD-10-CM | POA: Diagnosis not present

## 2018-02-05 DIAGNOSIS — Z01419 Encounter for gynecological examination (general) (routine) without abnormal findings: Secondary | ICD-10-CM | POA: Diagnosis not present

## 2018-02-05 DIAGNOSIS — Z113 Encounter for screening for infections with a predominantly sexual mode of transmission: Secondary | ICD-10-CM | POA: Diagnosis not present

## 2018-02-22 DIAGNOSIS — Z79899 Other long term (current) drug therapy: Secondary | ICD-10-CM | POA: Diagnosis not present

## 2018-02-22 DIAGNOSIS — Z68.41 Body mass index (BMI) pediatric, 5th percentile to less than 85th percentile for age: Secondary | ICD-10-CM | POA: Diagnosis not present

## 2018-02-22 DIAGNOSIS — M2550 Pain in unspecified joint: Secondary | ICD-10-CM | POA: Diagnosis not present

## 2018-02-22 DIAGNOSIS — L4050 Arthropathic psoriasis, unspecified: Secondary | ICD-10-CM | POA: Diagnosis not present

## 2018-03-28 DIAGNOSIS — Z113 Encounter for screening for infections with a predominantly sexual mode of transmission: Secondary | ICD-10-CM | POA: Diagnosis not present

## 2018-03-28 DIAGNOSIS — Z7189 Other specified counseling: Secondary | ICD-10-CM | POA: Diagnosis not present

## 2018-04-06 DIAGNOSIS — K011 Impacted teeth: Secondary | ICD-10-CM | POA: Diagnosis not present

## 2018-05-08 DIAGNOSIS — K011 Impacted teeth: Secondary | ICD-10-CM | POA: Diagnosis not present

## 2018-05-08 DIAGNOSIS — D680 Von Willebrand's disease: Secondary | ICD-10-CM | POA: Diagnosis not present

## 2018-05-08 DIAGNOSIS — M069 Rheumatoid arthritis, unspecified: Secondary | ICD-10-CM | POA: Diagnosis not present

## 2018-05-08 DIAGNOSIS — F419 Anxiety disorder, unspecified: Secondary | ICD-10-CM | POA: Diagnosis not present

## 2018-05-08 DIAGNOSIS — J029 Acute pharyngitis, unspecified: Secondary | ICD-10-CM | POA: Diagnosis not present

## 2018-05-08 DIAGNOSIS — D500 Iron deficiency anemia secondary to blood loss (chronic): Secondary | ICD-10-CM | POA: Diagnosis not present

## 2018-05-08 DIAGNOSIS — R040 Epistaxis: Secondary | ICD-10-CM | POA: Diagnosis not present

## 2018-05-09 DIAGNOSIS — K011 Impacted teeth: Secondary | ICD-10-CM | POA: Diagnosis not present

## 2018-05-09 DIAGNOSIS — F419 Anxiety disorder, unspecified: Secondary | ICD-10-CM | POA: Diagnosis not present

## 2018-05-09 DIAGNOSIS — D680 Von Willebrand's disease: Secondary | ICD-10-CM | POA: Diagnosis not present

## 2018-05-09 DIAGNOSIS — J029 Acute pharyngitis, unspecified: Secondary | ICD-10-CM | POA: Diagnosis not present

## 2018-05-09 DIAGNOSIS — R040 Epistaxis: Secondary | ICD-10-CM | POA: Diagnosis not present

## 2018-05-09 DIAGNOSIS — M069 Rheumatoid arthritis, unspecified: Secondary | ICD-10-CM | POA: Diagnosis not present

## 2018-05-09 DIAGNOSIS — D500 Iron deficiency anemia secondary to blood loss (chronic): Secondary | ICD-10-CM | POA: Diagnosis not present

## 2018-06-18 DIAGNOSIS — R82998 Other abnormal findings in urine: Secondary | ICD-10-CM | POA: Diagnosis not present

## 2018-06-18 DIAGNOSIS — B373 Candidiasis of vulva and vagina: Secondary | ICD-10-CM | POA: Diagnosis not present

## 2018-08-13 DIAGNOSIS — Z124 Encounter for screening for malignant neoplasm of cervix: Secondary | ICD-10-CM | POA: Diagnosis not present

## 2018-08-13 DIAGNOSIS — R87610 Atypical squamous cells of undetermined significance on cytologic smear of cervix (ASC-US): Secondary | ICD-10-CM | POA: Diagnosis not present

## 2018-08-13 DIAGNOSIS — N72 Inflammatory disease of cervix uteri: Secondary | ICD-10-CM | POA: Diagnosis not present

## 2018-09-27 DIAGNOSIS — R300 Dysuria: Secondary | ICD-10-CM | POA: Diagnosis not present

## 2018-09-27 DIAGNOSIS — N760 Acute vaginitis: Secondary | ICD-10-CM | POA: Diagnosis not present

## 2018-09-27 DIAGNOSIS — R309 Painful micturition, unspecified: Secondary | ICD-10-CM | POA: Diagnosis not present

## 2018-09-27 DIAGNOSIS — R350 Frequency of micturition: Secondary | ICD-10-CM | POA: Diagnosis not present

## 2018-10-05 DIAGNOSIS — Z113 Encounter for screening for infections with a predominantly sexual mode of transmission: Secondary | ICD-10-CM | POA: Diagnosis not present

## 2018-10-26 DIAGNOSIS — J069 Acute upper respiratory infection, unspecified: Secondary | ICD-10-CM | POA: Diagnosis not present

## 2018-10-26 DIAGNOSIS — Z6825 Body mass index (BMI) 25.0-25.9, adult: Secondary | ICD-10-CM | POA: Diagnosis not present

## 2019-01-10 DIAGNOSIS — H538 Other visual disturbances: Secondary | ICD-10-CM | POA: Diagnosis not present

## 2019-01-10 DIAGNOSIS — H5213 Myopia, bilateral: Secondary | ICD-10-CM | POA: Diagnosis not present

## 2019-01-10 DIAGNOSIS — H52223 Regular astigmatism, bilateral: Secondary | ICD-10-CM | POA: Diagnosis not present
